# Patient Record
Sex: Female | Born: 1965 | Race: White | Hispanic: No | Smoking: Never smoker
Health system: Southern US, Community
[De-identification: ages and names within clinical notes are randomized; demographics above are authoritative.]

---

## 2018-02-21 NOTE — H&P (Signed)
Patient name Maria Beard. [facility] DICTATION# [id number] CSN# [id number]  Arvella Nigh, MD 02/21/2018 5:26 PM

## 2018-02-21 NOTE — H&P (Signed)
NAMEDWANA, GARIN [facility] MEDICAL RECORD [id number] ACCOUNT [id number] DATE OF BIRTH:04/07/1966 FACILITY: [facility] LOCATION: [facility] PHYSICIAN:Victorian Gunn Sherran Needs, MD  HISTORY AND PHYSICAL  DATE OF ADMISSION:  02/21/2018  In relation to the present admission, the patient was first seen in our practice on 09/11 of this year.  At that point in time, she had been followed for uterine fibroids and was on a progesterone-only pill in hopes of controlling heavy bleeding.  With  this, she had had several months without a period.  Then she would have 3 weeks of relatively heavy bleeding.  This last one lasted 2 weeks.  Her hemoglobin was 8.7.  We went ahead and stopped her progesterone-only pill and began on iron sulfate  supplementation.  We did a saline infusion ultrasound on 09/16 that showed either clots or endometrial polyps along with the fibroid.  The large fibroid was 4.5 cm.  In view of the polyps and/or clots, we are going to proceed with a hysteroscopy along  with D and C. She will continue on iron sulfate supplementation.  I did add birth control pills to her regimen.  ALLERGIES:  SHE HAS SEASONAL OUTDOOR ALLERGIES.  MEDICATIONS:  Oxybutynin 10 mg a day, Effexor 75 mg, Zyrtec and Prilosec.  PAST MEDICAL HISTORY:  She has a history of arthritis, migraine headaches, otherwise usual childhood diseases.  PAST SURGICAL HISTORY:  She has had 1 delivery in 1993 and delivery of twins in 1996.  SOCIAL HISTORY:  No tobacco or alcohol use.  FAMILY HISTORY:  Noncontributory.  REVIEW OF SYSTEMS:  Noncontributory.  PHYSICAL EXAMINATION: VITAL SIGNS:  The patient is afebrile with stable vital signs. HEENT:  The patient is normocephalic.  Pupils equal, round, reactive to light and accommodation.  Extraocular movements are intact.  Sclerae and conjunctivae are clear.  Oropharynx clear. LUNGS:  Clear. HEART:  Regular rhythm and rate without murmurs or gallops. ABDOMEN:  Benign.  No masses, megaly or  tenderness. PELVIC:  Normal external genitalia.  Vaginal mucosa is clear.  Cervix unremarkable.  Uterus upper limits normal size, irregular.  Adnexa unremarkable. EXTREMITIES:  Trace edema. NEUROLOGIC:  Grossly normal limits.  IMPRESSION: 1.  Perimenopausal abnormal uterine bleeding on progesterone-only pill. 2.  Uterine fibroids. 3.  Possible endometrial polyps versus clots.  PLAN:  The patient to undergo hysteroscopy with D and C. The risks of surgery have been discussed including the risk of infection.  The risk of hemorrhage that could require transfusion with the risk of AIDS or hepatitis.  Excessive bleeding could  require hysterectomy.  There is a risk of perforation with injury to adjacent organs.  This can require further exploratory surgery.  The risk of deep venous thrombosis and pulmonary embolus.  The patient expressed understanding of the indications and  risks.  LN/NUANCE  D:02/21/2018 T:02/21/2018 JOB:[id number]

## 2018-02-22 ENCOUNTER — Other Ambulatory Visit: Payer: Self-pay

## 2018-02-22 ENCOUNTER — Encounter (HOSPITAL_BASED_OUTPATIENT_CLINIC_OR_DEPARTMENT_OTHER): Payer: Self-pay | Admitting: *Deleted

## 2018-02-22 NOTE — Progress Notes (Signed)
Spoke with patient via telephone for pre op interview. Needs CBC and qualitative pregnancy test AM of surgery. NPO after MN. Will take prilosec and effexor AM of surgery with a sip of water. Patient to arrive at 0530.

## 2018-02-27 NOTE — Anesthesia Preprocedure Evaluation (Addendum)
Anesthesia Evaluation  Patient identified by MRN, date of birth, ID band Patient awake    Reviewed: Allergy & Precautions, NPO status , Patient's Chart, lab work & pertinent test results  Airway Mallampati: I  TM Distance: >3 FB Neck ROM: Full    Dental  (+) Teeth Intact, Missing,    Pulmonary    breath sounds clear to auscultation       Cardiovascular negative cardio ROS   Rhythm:Regular Rate:Normal     Neuro/Psych    GI/Hepatic negative GI ROS, Neg liver ROS,   Endo/Other  negative endocrine ROS  Renal/GU negative Renal ROS  negative genitourinary   Musculoskeletal negative musculoskeletal ROS (+)   Abdominal Normal abdominal exam  (+)   Peds  Hematology   Anesthesia Other Findings   Reproductive/Obstetrics                            Anesthesia Physical Anesthesia Plan  ASA: II  Anesthesia Plan: General   Post-op Pain Management:    Induction: Intravenous  PONV Risk Score and Plan: 4 or greater and Ondansetron, Dexamethasone, Midazolam and Scopolamine patch - Pre-op  Airway Management Planned: LMA  Additional Equipment:   Intra-op Plan:   Post-operative Plan: Extubation in OR  Informed Consent: I have reviewed the patients History and Physical, chart, labs and discussed the procedure including the risks, benefits and alternatives for the proposed anesthesia with the patient or authorized representative who has indicated his/her understanding and acceptance.   Dental advisory given  Plan Discussed with: CRNA  Anesthesia Plan Comments:        Anesthesia Quick Evaluation

## 2018-02-28 ENCOUNTER — Other Ambulatory Visit: Payer: Self-pay

## 2018-02-28 ENCOUNTER — Ambulatory Visit (HOSPITAL_BASED_OUTPATIENT_CLINIC_OR_DEPARTMENT_OTHER): Admitting: Anesthesiology

## 2018-02-28 ENCOUNTER — Encounter (HOSPITAL_BASED_OUTPATIENT_CLINIC_OR_DEPARTMENT_OTHER): Payer: Self-pay | Admitting: *Deleted

## 2018-02-28 ENCOUNTER — Encounter (HOSPITAL_BASED_OUTPATIENT_CLINIC_OR_DEPARTMENT_OTHER)
Admission: RE | Disposition: A | Discharge status: Home / Self Care | Payer: Self-pay | Source: Ambulatory Visit | Attending: Obstetrics and Gynecology

## 2018-02-28 ENCOUNTER — Ambulatory Visit (HOSPITAL_BASED_OUTPATIENT_CLINIC_OR_DEPARTMENT_OTHER)
Admission: RE | Admit: 2018-02-28 | Discharge: 2018-02-28 | Disposition: A | Discharge status: Home / Self Care | Source: Ambulatory Visit | Attending: Obstetrics and Gynecology | Admitting: Obstetrics and Gynecology

## 2018-02-28 DIAGNOSIS — N840 Polyp of corpus uteri: Secondary | ICD-10-CM | POA: Diagnosis not present

## 2018-02-28 DIAGNOSIS — N924 Excessive bleeding in the premenopausal period: Secondary | ICD-10-CM | POA: Insufficient documentation

## 2018-02-28 DIAGNOSIS — D259 Leiomyoma of uterus, unspecified: Secondary | ICD-10-CM | POA: Diagnosis not present

## 2018-02-28 HISTORY — PX: DILATATION & CURETTAGE/HYSTEROSCOPY WITH MYOSURE: SHX6511

## 2018-02-28 LAB — TYPE AND SCREEN
ABO/RH(D): A POS
Antibody Screen: NEGATIVE

## 2018-02-28 LAB — CBC
HCT: 31.9 % — ABNORMAL LOW (ref 36.0–46.0)
Hemoglobin: 9.3 g/dL — ABNORMAL LOW (ref 12.0–15.0)
MCH: 22.2 pg — AB (ref 26.0–34.0)
MCHC: 29.2 g/dL — ABNORMAL LOW (ref 30.0–36.0)
MCV: 76.3 fL — AB (ref 78.0–100.0)
PLATELETS: 260 10*3/uL (ref 150–400)
RBC: 4.18 MIL/uL (ref 3.87–5.11)
RDW: 20.7 % — AB (ref 11.5–15.5)
WBC: 8.2 10*3/uL (ref 4.0–10.5)

## 2018-02-28 LAB — ABO/RH: ABO/RH(D): A POS

## 2018-02-28 LAB — HCG, SERUM, QUALITATIVE: Preg, Serum: NEGATIVE

## 2018-02-28 SURGERY — DILATATION & CURETTAGE/HYSTEROSCOPY WITH MYOSURE
Anesthesia: General | Site: "Vagina "

## 2018-02-28 MED ORDER — FENTANYL CITRATE (PF) 100 MCG/2ML IJ SOLN
INTRAMUSCULAR | Status: DC | PRN
  Administered 2018-02-28: 50 ug via INTRAVENOUS
  Administered 2018-02-28 (×2): 25 ug via INTRAVENOUS

## 2018-02-28 MED ORDER — FENTANYL CITRATE (PF) 100 MCG/2ML IJ SOLN
INTRAMUSCULAR | Status: AC
  Filled 2018-02-28: qty 2

## 2018-02-28 MED ORDER — KETOROLAC TROMETHAMINE 30 MG/ML IJ SOLN
INTRAMUSCULAR | Status: DC | PRN
  Administered 2018-02-28: 30 mg via INTRAVENOUS

## 2018-02-28 MED ORDER — OXYCODONE HCL 5 MG PO TABS
5.0000 mg | ORAL_TABLET | Freq: Once | ORAL | Status: DC | PRN
  Filled 2018-02-28: qty 1

## 2018-02-28 MED ORDER — LACTATED RINGERS IV SOLN
INTRAVENOUS | Status: DC
  Administered 2018-02-28: 1000 mL via INTRAVENOUS
  Filled 2018-02-28: qty 1000

## 2018-02-28 MED ORDER — LACTATED RINGERS IV SOLN
INTRAVENOUS | Status: DC
  Administered 2018-02-28: 06:00:00 via INTRAVENOUS
  Filled 2018-02-28: qty 1000

## 2018-02-28 MED ORDER — PROPOFOL 10 MG/ML IV BOLUS
INTRAVENOUS | Status: AC
  Filled 2018-02-28: qty 40

## 2018-02-28 MED ORDER — OXYCODONE HCL 5 MG/5ML PO SOLN
5.0000 mg | Freq: Once | ORAL | Status: DC | PRN
  Filled 2018-02-28: qty 5

## 2018-02-28 MED ORDER — DEXAMETHASONE SODIUM PHOSPHATE 10 MG/ML IJ SOLN
INTRAMUSCULAR | Status: AC
  Filled 2018-02-28: qty 1

## 2018-02-28 MED ORDER — SODIUM CHLORIDE 0.9 % IR SOLN
Status: DC | PRN
  Administered 2018-02-28: 3000 mL

## 2018-02-28 MED ORDER — CEFAZOLIN SODIUM-DEXTROSE 2-4 GM/100ML-% IV SOLN
2.0000 g | INTRAVENOUS | Status: AC
  Administered 2018-02-28: 2 g via INTRAVENOUS
  Filled 2018-02-28: qty 100

## 2018-02-28 MED ORDER — ONDANSETRON HCL 4 MG/2ML IJ SOLN
INTRAMUSCULAR | Status: DC | PRN
  Administered 2018-02-28: 4 mg via INTRAVENOUS

## 2018-02-28 MED ORDER — MIDAZOLAM HCL 5 MG/5ML IJ SOLN
INTRAMUSCULAR | Status: DC | PRN
  Administered 2018-02-28: 2 mg via INTRAVENOUS

## 2018-02-28 MED ORDER — CEFAZOLIN SODIUM-DEXTROSE 2-4 GM/100ML-% IV SOLN
INTRAVENOUS | Status: AC
  Filled 2018-02-28: qty 100

## 2018-02-28 MED ORDER — PROPOFOL 10 MG/ML IV BOLUS
INTRAVENOUS | Status: DC | PRN
  Administered 2018-02-28: 50 mg via INTRAVENOUS
  Administered 2018-02-28: 150 mg via INTRAVENOUS

## 2018-02-28 MED ORDER — ONDANSETRON HCL 4 MG/2ML IJ SOLN
INTRAMUSCULAR | Status: AC
  Filled 2018-02-28: qty 2

## 2018-02-28 MED ORDER — LIDOCAINE 2% (20 MG/ML) 5 ML SYRINGE
INTRAMUSCULAR | Status: AC
  Filled 2018-02-28: qty 5

## 2018-02-28 MED ORDER — MIDAZOLAM HCL 2 MG/2ML IJ SOLN
INTRAMUSCULAR | Status: AC
  Filled 2018-02-28: qty 2

## 2018-02-28 MED ORDER — LIDOCAINE HCL (CARDIAC) PF 100 MG/5ML IV SOSY
PREFILLED_SYRINGE | INTRAVENOUS | Status: DC | PRN
  Administered 2018-02-28: 100 mg via INTRAVENOUS

## 2018-02-28 MED ORDER — MEPERIDINE HCL 25 MG/ML IJ SOLN
6.2500 mg | INTRAMUSCULAR | Status: DC | PRN
  Filled 2018-02-28: qty 1

## 2018-02-28 MED ORDER — PROMETHAZINE HCL 25 MG/ML IJ SOLN
6.2500 mg | INTRAMUSCULAR | Status: DC | PRN
  Filled 2018-02-28: qty 1

## 2018-02-28 MED ORDER — HYDROMORPHONE HCL 1 MG/ML IJ SOLN
0.2500 mg | INTRAMUSCULAR | Status: DC | PRN
  Filled 2018-02-28: qty 0.5

## 2018-02-28 MED ORDER — KETOROLAC TROMETHAMINE 30 MG/ML IJ SOLN
INTRAMUSCULAR | Status: AC
  Filled 2018-02-28: qty 1

## 2018-02-28 MED ORDER — DEXAMETHASONE SODIUM PHOSPHATE 4 MG/ML IJ SOLN
INTRAMUSCULAR | Status: DC | PRN
  Administered 2018-02-28: 10 mg via INTRAVENOUS

## 2018-02-28 SURGICAL SUPPLY — 26 items
CANISTER SUCT 3000ML PPV (MISCELLANEOUS) ×6 IMPLANT
CATH ROBINSON RED A/P 16FR (CATHETERS) ×3 IMPLANT
DEVICE MYOSURE LITE (MISCELLANEOUS) ×2 IMPLANT
DEVICE MYOSURE REACH (MISCELLANEOUS) IMPLANT
DILATOR CANAL MILEX (MISCELLANEOUS) IMPLANT
GAUZE SPONGE 4X4 16PLY XRAY LF (GAUZE/BANDAGES/DRESSINGS) ×3 IMPLANT
GLOVE BIO SURGEON STRL SZ 6.5 (GLOVE) ×2 IMPLANT
GLOVE BIO SURGEON STRL SZ7 (GLOVE) ×6 IMPLANT
GLOVE BIOGEL PI IND STRL 6.5 (GLOVE) ×1 IMPLANT
GLOVE BIOGEL PI IND STRL 7.5 (GLOVE) ×1 IMPLANT
GLOVE BIOGEL PI INDICATOR 6.5 (GLOVE) ×1
GLOVE BIOGEL PI INDICATOR 7.5 (GLOVE) ×1
GOWN STRL REUS W/TWL XL LVL3 (GOWN DISPOSABLE) ×3 IMPLANT
IV NS IRRIG 3000ML ARTHROMATIC (IV SOLUTION) ×3 IMPLANT
KIT PROCEDURE FLUENT (KITS) ×3 IMPLANT
KIT TURNOVER CYSTO (KITS) ×3 IMPLANT
MYOSURE XL FIBROID REM (MISCELLANEOUS)
NDL SPNL 18GX3.5 QUINCKE PK (NEEDLE) ×1 IMPLANT
NEEDLE SPNL 18GX3.5 QUINCKE PK (NEEDLE) ×2 IMPLANT
PACK VAGINAL MINOR WOMEN LF (CUSTOM PROCEDURE TRAY) ×3 IMPLANT
PAD OB MATERNITY 4.3X12.25 (PERSONAL CARE ITEMS) ×3 IMPLANT
PAD PREP 24X48 CUFFED NSTRL (MISCELLANEOUS) ×3 IMPLANT
SEAL ROD LENS SCOPE MYOSURE (ABLATOR) ×3 IMPLANT
SYSTEM TISS REMOVAL MYSR XL RM (MISCELLANEOUS) IMPLANT
TOWEL OR 17X24 6PK STRL BLUE (TOWEL DISPOSABLE) ×6 IMPLANT
WATER STERILE IRR 500ML POUR (IV SOLUTION) ×3 IMPLANT

## 2018-02-28 NOTE — Anesthesia Procedure Notes (Signed)
Procedure Name: LMA Insertion Date/Time: 02/28/2018 7:32 AM Performed by: Justice Rocher, CRNA Pre-anesthesia Checklist: Patient identified, Emergency Drugs available, Suction available and Patient being monitored Patient Re-evaluated:Patient Re-evaluated prior to induction Oxygen Delivery Method: Circle system utilized Preoxygenation: Pre-oxygenation with 100% oxygen Induction Type: IV induction Ventilation: Mask ventilation without difficulty LMA: LMA inserted LMA Size: 4.0 Number of attempts: 1 Airway Equipment and Method: Bite block Placement Confirmation: positive ETCO2 and breath sounds checked- equal and bilateral Tube secured with: Tape Dental Injury: Teeth and Oropharynx as per pre-operative assessment

## 2018-02-28 NOTE — Brief Op Note (Signed)
02/28/2018  7:48 AM  PATIENT:  Maria Beard  52 y.o. female  PRE-OPERATIVE DIAGNOSIS:  POLYPS  POST-OPERATIVE DIAGNOSIS:  POLYPS  PROCEDURE:  Procedure(s): DILATATION & CURETTAGE/HYSTEROSCOPY WITH MYOSURE (N/A)  SURGEON:  Surgeon(s) and Role:    * Soraida Vickers, MD - Primary  PHYSICIAN ASSISTANT:   ASSISTANTS: none   ANESTHESIA:   general  EBL:  5 mL   BLOOD ADMINISTERED:none  DRAINS: none and na   LOCAL MEDICATIONS USED:  NONE  SPECIMEN:  Source of Specimen:  endometrial polyp and curettings  DISPOSITION OF SPECIMEN:  PATHOLOGY  COUNTS:  YES  TOURNIQUET:  * No tourniquets in log *  DICTATION: .Other Dictation: Dictation Number [id number]  PLAN OF CARE: Discharge to home after PACU  PATIENT DISPOSITION:  PACU - hemodynamically stable.   Delay start of Pharmacological VTE agent (>24hrs) due to surgical blood loss or risk of bleeding: not applicable

## 2018-02-28 NOTE — Discharge Instructions (Signed)
° ° °  Do not take any nonsteroidal anti inflammatories (Ibuprofen, Advil, Aleve, Motrin) until after 1:45 pm today.   Post Anesthesia Home Care Instructions  Activity: Get plenty of rest for the remainder of the day. A responsible individual must stay with you for 24 hours following the procedure.  For the next 24 hours, DO NOT: -Drive a car -Paediatric nurse -Drink alcoholic beverages -Take any medication unless instructed by your physician -Make any legal decisions or sign important papers.  Meals: Start with liquid foods such as gelatin or soup. Progress to regular foods as tolerated. Avoid greasy, spicy, heavy foods. If nausea and/or vomiting occur, drink only clear liquids until the nausea and/or vomiting subsides. Call your physician if vomiting continues.  Special Instructions/Symptoms: Your throat may feel dry or sore from the anesthesia or the breathing tube placed in your throat during surgery. If this causes discomfort, gargle with warm salt water. The discomfort should disappear within 24 hours.

## 2018-02-28 NOTE — Transfer of Care (Signed)
Immediate Anesthesia Transfer of Care Note  Patient: Maria Beard  Procedure(s) Performed: Procedure(s) (LRB): DILATATION & CURETTAGE/HYSTEROSCOPY WITH MYOSURE (N/A)  Patient Location: PACU  Anesthesia Type: General  Level of Consciousness: awake, sedated, patient cooperative and responds to stimulation  Airway & Oxygen Therapy: Patient Spontanous Breathing and Patient connected to NCO2  Post-op Assessment: Report given to PACU RN, Post -op Vital signs reviewed and stable and Patient moving all extremities  Post vital signs: Reviewed and stable  Complications: No apparent anesthesia complications

## 2018-02-28 NOTE — H&P (Signed)
  History and physical exam unchanged 

## 2018-02-28 NOTE — Anesthesia Postprocedure Evaluation (Signed)
Anesthesia Post Note  Patient: Mizani Dilday  Procedure(s) Performed: DILATATION & CURETTAGE/HYSTEROSCOPY WITH MYOSURE (N/A Vagina )     Patient location during evaluation: PACU Anesthesia Type: General Level of consciousness: awake and alert Pain management: pain level controlled Vital Signs Assessment: post-procedure vital signs reviewed and stable Respiratory status: spontaneous breathing, nonlabored ventilation, respiratory function stable and patient connected to nasal cannula oxygen Cardiovascular status: blood pressure returned to baseline and stable Postop Assessment: no apparent nausea or vomiting Anesthetic complications: no    Last Vitals:  Vitals:   02/28/18 0854 02/28/18 0900  BP:  126/85  Pulse: 76 74  Resp: 18 17  Temp:    SpO2: 100% 97%    Last Pain:  Vitals:   02/28/18 0845  TempSrc:   PainSc: 0-No pain                 Effie Berkshire

## 2018-02-28 NOTE — Op Note (Signed)
NAMEAMARI, BURNSWORTH [facility] MEDICAL RECORD [id number] ACCOUNT [id number] DATE OF BIRTH:05-Jun-1966 FACILITY: [facility] LOCATION: [facility] PHYSICIAN:Kimiye Strathman Sherran Needs, MD  OPERATIVE REPORT  DATE OF PROCEDURE:  02/28/2018  PREOPERATIVE DIAGNOSIS:  Endometrial polyp.  POSTOPERATIVE DIAGNOSIS:  Endometrial polyp.  PROCEDURES:  Cervical dilation, hysteroscopy with resection of endometrial polyp.  BLOOD REPLACED:  None.  COMPLICATIONS:  None.  INDICATIONS:  See dictated history and physical.  DESCRIPTION OF PROCEDURE:  The patient was taken to the OR, placed in supine position.  After satisfactory level of general anesthesia was obtained, the patient was placed in the dorsal lithotomy position using the Allen stirrups.  Perineum prepped with  Betadine, draped in sterile field.  Speculum was placed in the vaginal vault.  Cervix grasped with Ardis Hughs tenaculum.  Uterus sounded to 9 cm.  Cervix was serially dilated.  Hysteroscope was introduced.  Intrauterine cavity was distended using saline.  On  visualization, she had 2 endometrial polyps on the posterior wall.  The rest of the endometrium looked smooth and atrophic.  We brought in the MyoSure and resected both polyps completely.  No other pathology was noted.  The hysteroscope was then  removed.  Deficit was minimal.  We then did endometrial curettings.  All tissue was sent to pathology.  At this point in time, the single tooth tenaculum removed.  The patient was taken out of dorsal lithotomy position, was alert and extubated and  transferred to recovery room in good condition.  Sponge, instrument and needle count were reported correct by the circulating nurse.  AN/NUANCE  D:02/28/2018 T:02/28/2018 JOB:[id number]

## 2018-03-01 ENCOUNTER — Encounter (HOSPITAL_BASED_OUTPATIENT_CLINIC_OR_DEPARTMENT_OTHER): Payer: Self-pay | Admitting: Obstetrics and Gynecology

## 2018-08-10 ENCOUNTER — Other Ambulatory Visit: Payer: Self-pay | Admitting: Obstetrics and Gynecology

## 2018-08-10 DIAGNOSIS — R928 Other abnormal and inconclusive findings on diagnostic imaging of breast: Secondary | ICD-10-CM

## 2018-08-16 ENCOUNTER — Ambulatory Visit: Admission: RE | Admit: 2018-08-16 | Source: Ambulatory Visit

## 2018-08-16 ENCOUNTER — Ambulatory Visit
Admission: RE | Admit: 2018-08-16 | Discharge: 2018-08-16 | Disposition: A | Discharge status: Home / Self Care | Payer: 59 | Source: Ambulatory Visit | Attending: Obstetrics and Gynecology | Admitting: Obstetrics and Gynecology

## 2018-08-16 ENCOUNTER — Other Ambulatory Visit: Payer: Self-pay | Admitting: Obstetrics and Gynecology

## 2018-08-16 DIAGNOSIS — R928 Other abnormal and inconclusive findings on diagnostic imaging of breast: Secondary | ICD-10-CM

## 2021-06-27 ENCOUNTER — Other Ambulatory Visit: Payer: Self-pay | Admitting: Obstetrics and Gynecology

## 2021-06-27 DIAGNOSIS — Z803 Family history of malignant neoplasm of breast: Secondary | ICD-10-CM

## 2021-07-07 ENCOUNTER — Encounter: Payer: Self-pay | Admitting: Registered"

## 2021-07-07 ENCOUNTER — Encounter: Payer: 59 | Attending: Obstetrics and Gynecology | Admitting: Registered"

## 2021-07-07 ENCOUNTER — Other Ambulatory Visit: Payer: Self-pay

## 2021-07-07 DIAGNOSIS — E785 Hyperlipidemia, unspecified: Secondary | ICD-10-CM | POA: Diagnosis present

## 2021-07-07 NOTE — Patient Instructions (Addendum)
Consider using you step tracker again. Look to see if your Fitbit has the cholesterol program to sign up.  Soluble fiber sources: Apples, beans, pistachios, oatmeal, flaxseed (ground) start with 1 tsp and work up to 2 tablespoons.  Reduce inflammation: Whole grains, fruits and vegetables (red grapes anti-inflammatory property of wine)  Continue reading labels for Sat fat and choosing lean meats, limiting cheese intake as you are not having every day and choose lower fat, continue having reduced fat dairy.  Continue using healthy fats such as olive oil, nuts such as almonds, walnuts, pecans, pistachios.  Keep a food diary for 3 days before your next appointment.

## 2021-07-07 NOTE — Progress Notes (Signed)
Medical Nutrition Therapy  Appointment Start time:  3818  Appointment End time:  1224  Primary concerns today: elevated cholesterol  Referral diagnosis: E78.5 hyperlipidemia Preferred learning style: no preference indicated Learning readiness: ready  NUTRITION ASSESSMENT   Anthropometrics  Not assessed this visit    Clinical Medical Hx: HTN controlled with medication, elevated cholesterol  Medications: reviewed, no cholesterol meds Labs: LDL 140 Notable Signs/Symptoms: weight gain  Lifestyle & Dietary Hx Pt states she noticed that when she lost weight with [employer] a few years ago her LDL cholesterol improved. Pt states she will take statin if needed, but would like to see what she can do with dietary changes.   Pt states rarely drinks sweet tea, no juice due to sugar content. Consumes skim or 1% dairy products. Pt reports she doesn't keep ice cream in the house because if there will eat every night before bed. Pt states she eats pretzels daily.   Pt reports she has fitbit, but not wearing lately, likes to walk with dogs, but hasn't lately due to weather. Pt states in the past the fitness tracker motivated her to walk more.  Estimated daily fluid intake: "not enough" doesn't think about it Supplements: none Sleep: 6 hr Stress / self-care: 4/10 Current average weekly physical activity: ADL  24-Hr Dietary Recall First Meal: eats ~2 hrs after waking Eggs & grits OR PB toast 100% whole wheat or pumpernickel OR yogurt [employer] [place] light & fit Snack: sometimes apple or cracker and cheese Second Meal: soup or [place] sandwich OR crackers, cheese, fruit Snack: same Third Meal: (mostly poultry for meat and mostly baked), at least 1 vegetable, not too many potatoes. Snack: same as above (frozen blueberries) Beverages: coffee, powdered coffeemate, almond creamer, 2-3 c in the morning, 1 can 12 oz diet caffeine free soda, coke or sprite tries to get clear.  NUTRITION DIAGNOSIS   NI-5.8.5 Inadeqate fiber intake As related to less than recommended intake of fruits and vegetables.  As evidenced by dietary recall.   NUTRITION INTERVENTION  Nutrition education (E-1) on the following topics:  Role of soluble fiber in lowering LDL cholesterol MyPlate Label reading  Handouts Provided Include  Using the Plate Method for a Balanced Meal Plan ([employer]) New Nutrition Facts Label  Learning Style & Readiness for Change Teaching method utilized: Visual & Auditory  Demonstrated degree of understanding via: Teach Back  Barriers to learning/adherence to lifestyle change: none  Goals Established by Pt Consider using you step tracker again. Look to see if your Fitbit has the cholesterol program to sign up.  Soluble fiber sources: Apples, beans, pistachios, oatmeal, flaxseed (ground) start with 1 tsp and work up to 2 tablespoons.  Reduce inflammation: Whole grains, fruits and vegetables (red grapes anti-inflammatory property of wine)  Continue reading labels for Sat fat and choosing lean meats, limiting cheese intake as you are not having every day and choose lower fat, continue having reduced fat dairy.  Continue using healthy fats such as olive oil, nuts such as almonds, walnuts, pecans, pistachios.  Keep a food diary for 3 days before your next appointment.  MONITORING & EVALUATION Dietary intake, weekly physical activity, in 4-6 weeks.  Next Steps  Patient is to keep a food diary prior to next appointment.

## 2021-07-15 ENCOUNTER — Ambulatory Visit
Admission: RE | Admit: 2021-07-15 | Discharge: 2021-07-15 | Disposition: A | Discharge status: Home / Self Care | Payer: 59 | Source: Ambulatory Visit | Attending: Obstetrics and Gynecology | Admitting: Obstetrics and Gynecology

## 2021-07-15 DIAGNOSIS — Z803 Family history of malignant neoplasm of breast: Secondary | ICD-10-CM

## 2021-07-15 MED ORDER — GADOBUTROL 1 MMOL/ML IV SOLN
7.0000 mL | Freq: Once | INTRAVENOUS | Status: AC | PRN
  Administered 2021-07-15: 7 mL via INTRAVENOUS

## 2021-07-18 ENCOUNTER — Other Ambulatory Visit: Payer: Self-pay | Admitting: Obstetrics and Gynecology

## 2021-07-18 DIAGNOSIS — Z09 Encounter for follow-up examination after completed treatment for conditions other than malignant neoplasm: Secondary | ICD-10-CM

## 2021-08-15 ENCOUNTER — Ambulatory Visit: Payer: 59 | Admitting: Registered"

## 2021-09-12 ENCOUNTER — Encounter: Payer: Self-pay | Admitting: Registered"

## 2021-09-12 ENCOUNTER — Encounter: Payer: 59 | Attending: Obstetrics and Gynecology | Admitting: Registered"

## 2021-09-12 DIAGNOSIS — E785 Hyperlipidemia, unspecified: Secondary | ICD-10-CM | POA: Insufficient documentation

## 2021-09-12 DIAGNOSIS — Z713 Dietary counseling and surveillance: Secondary | ICD-10-CM | POA: Insufficient documentation

## 2021-09-12 NOTE — Progress Notes (Signed)
Medical Nutrition Therapy  ?Appointment Start time:  0388  Appointment End time:  1137 ? ?Primary concerns today: elevated cholesterol  ?Referral diagnosis: E78.5 hyperlipidemia ?Preferred learning style: no preference indicated ?Learning readiness: ready ? ?NUTRITION ASSESSMENT  ? ?Anthropometrics  ?Not assessed this visit   ? ?Clinical ?Medical Hx: HTN controlled with medication, elevated cholesterol  ?Medications: reviewed, no cholesterol meds ?Labs: LDL 140 ?Notable Signs/Symptoms: weight gain ? ?Lifestyle & Dietary Hx ?Pt reports since last visit she has been diagnosed with osteopenia and has started taking calcium and increased her vitamin d supplement. Pt states she has also started a resistance exercise program by [employer], 60 day strong and fit challenge and is now in 3rd week. ? ?Pt states started putting flaxseed in her oatmeal and focusing on getting whole grains, and paying attention to how much fiber she is getting in diet.  ? ?Eats lunch at desk sometimes works through lunch. Pt states avoids eating too much bananas d/t a lot of potassium which irritates her bladder. ? ?Pt states she is interested in taking an Omega 3 supplement but will look into consumer reports to check on specific product claims. ? ?Estimated daily fluid intake: "has improved but needs to do better" ?Supplements: none ?Sleep: 6 hr ?Stress / self-care: 4/10 ?Current average weekly physical activity: ADL ? ?24-Hr Dietary Recall ?First Meal: eats ~2 hrs after waking ?Oatmeal OR [place] [place] light & fit OR fruit ?Snack: pretzels and mozzarella cheese ?Second Meal: [place] Boars head, 1 thin slice cheese sandwich whole grain bread  ?Snack: fruit ?Third Meal: chicken, rice, broccoli, cream of chicken soup reduced sodium, peas (typically [place] meatloaf, roasted carrots sometimes potatoes) ?Snack: same as above (frozen blueberries) continues ?Beverages: 3 c milk for the calcium, 2 c coffee with 2 tsp powdered coffeemate and 1-2 T almond  creamer, ~8 oz (less than before) diet caffeine free soda, coke or sprite tries to get clear. ? ?NUTRITION DIAGNOSIS  ?NI-5.8.5 Inadeqate fiber intake As related to less than recommended intake of fruits and vegetables.  As evidenced by dietary recall. ? ?NUTRITION INTERVENTION  ?Nutrition education (E-1) on the following topics:  ?Role of saturated fat in elevated blood LDL cholesterol ?Omega 3 ? ?Handouts Provided Include none ? ?Learning Style & Readiness for Change ?Teaching method utilized: Visual & Auditory  ?Demonstrated degree of understanding via: Teach Back  ?Barriers to learning/adherence to lifestyle change: none ? ?Goals Established by Pt ?Continue with changes to diet and exercise ? ?MONITORING & EVALUATION ?Dietary intake, weekly physical activity prn ? ?Next Steps  ?Keep appt with MD to get updated lipid lab work. Return for follow-up if desired. ?

## 2022-01-13 ENCOUNTER — Ambulatory Visit
Admission: RE | Admit: 2022-01-13 | Discharge: 2022-01-13 | Disposition: A | Discharge status: Home / Self Care | Payer: 59 | Source: Ambulatory Visit | Attending: Obstetrics and Gynecology | Admitting: Obstetrics and Gynecology

## 2022-01-13 DIAGNOSIS — Z09 Encounter for follow-up examination after completed treatment for conditions other than malignant neoplasm: Secondary | ICD-10-CM

## 2022-01-13 MED ORDER — GADOBUTROL 1 MMOL/ML IV SOLN
7.0000 mL | Freq: Once | INTRAVENOUS | Status: AC | PRN
  Administered 2022-01-13: 7 mL via INTRAVENOUS

## 2022-01-21 ENCOUNTER — Ambulatory Visit
Admission: RE | Admit: 2022-01-21 | Discharge: 2022-01-21 | Disposition: A | Discharge status: Home / Self Care | Payer: 59 | Source: Ambulatory Visit | Attending: Obstetrics and Gynecology | Admitting: Obstetrics and Gynecology

## 2022-01-21 ENCOUNTER — Other Ambulatory Visit: Payer: Self-pay | Admitting: Obstetrics and Gynecology

## 2022-01-21 DIAGNOSIS — R918 Other nonspecific abnormal finding of lung field: Secondary | ICD-10-CM

## 2022-12-03 IMAGING — MR MR BREAST BILAT WO/W CM
7 of 11 series · 33 of 48 positions shown · IV contrast (7ml gadavist)
Comparison: None.

CLINICAL DATA: High risk screening.  Elevated lifetime risk of 23%.

EXAM:
BILATERAL BREAST MRI WITH AND WITHOUT CONTRAST
TECHNIQUE: Multiplanar, multisequence MR images of both breasts were obtained
prior to and following the intravenous administration of 7 ml of
Gadavist

[Series 2: t2_tirm_tra ipat (a-p) · axial · 3.0mm · 0.78mm/px · 1 of 55 slices shown]
[im 1/55]
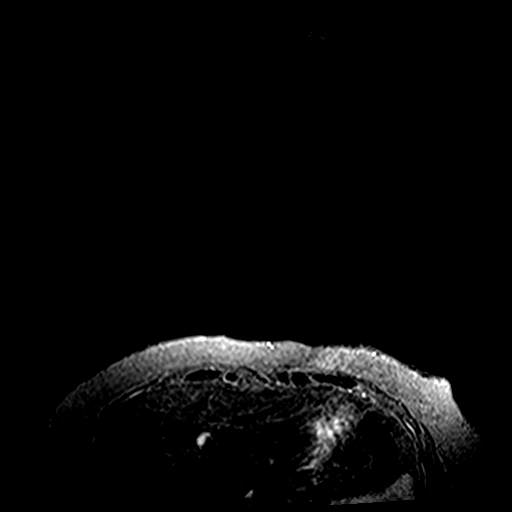

[Series 3: fl3d pre-cm no · axial · non-contrast · 1.2mm · 1.04mm/px · z∈[-75,+97]mm · 5 of 144 slices shown]
[im 1/144]
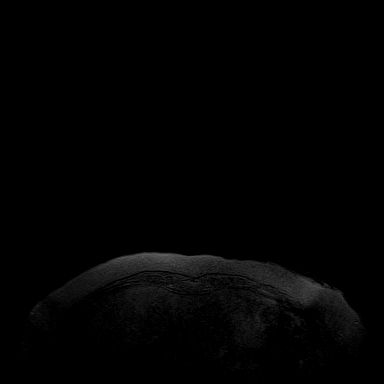
[im 36/144]
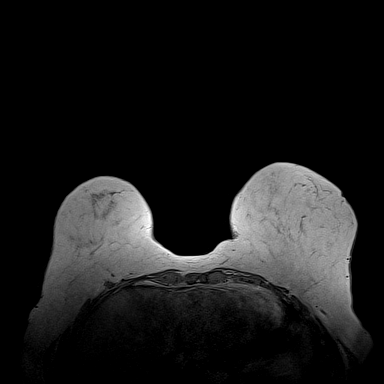
[im 72/144]
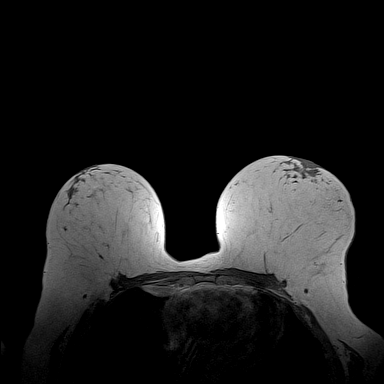
[im 108/144]
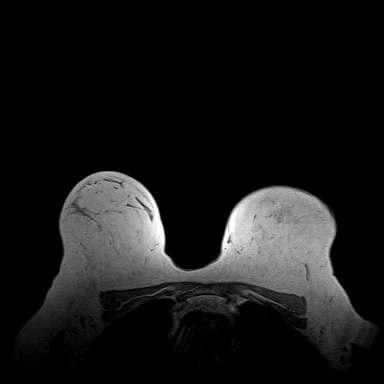
[im 144/144]
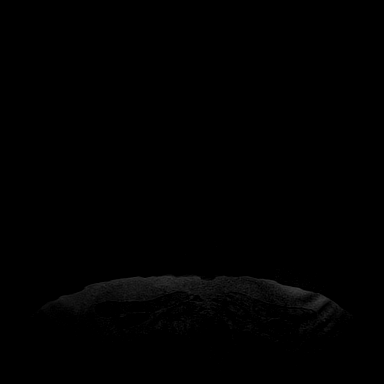

[Series 4: fl3d pre-cm · axial · non-contrast · 1.2mm · 1.04mm/px · z∈[-75,+97]mm · 5 of 144 slices shown]
[im 1/144]
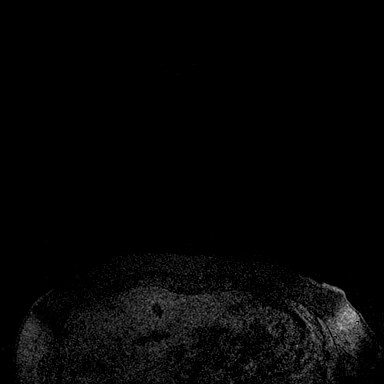
[im 36/144]
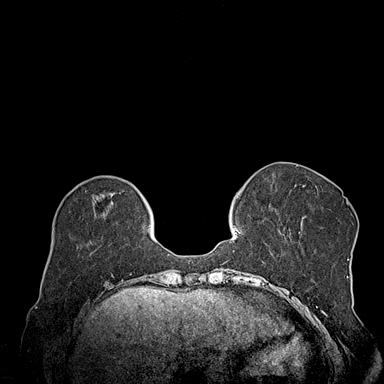
[im 72/144]
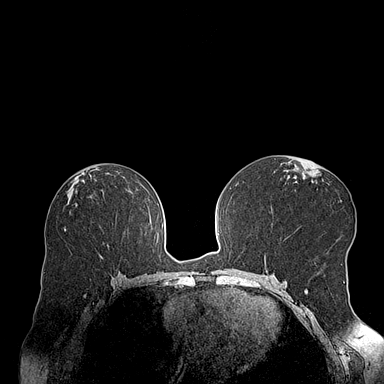
[im 108/144]
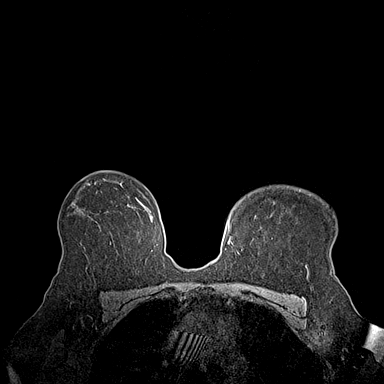
[im 144/144]
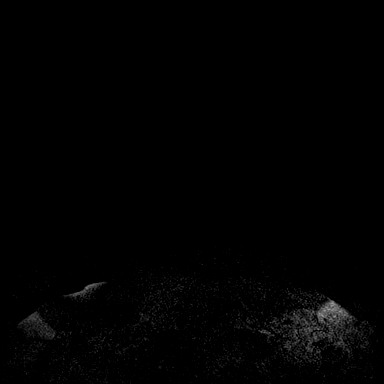

[Series 5: fl3d post-cm 20 · axial · 1.2mm · 1.04mm/px · z∈[-75,+97]mm · 5 of 144 slices shown (1 of 2)]
[im 1/144]
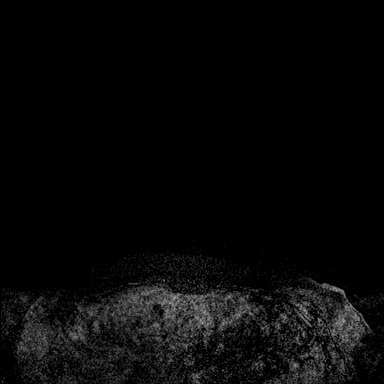
[im 36/144]
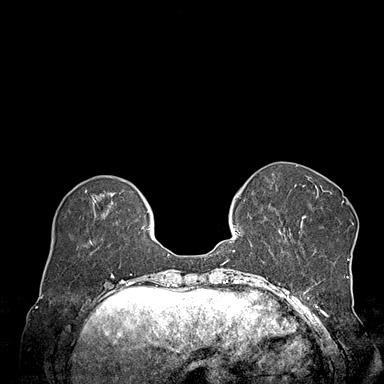
[im 72/144]
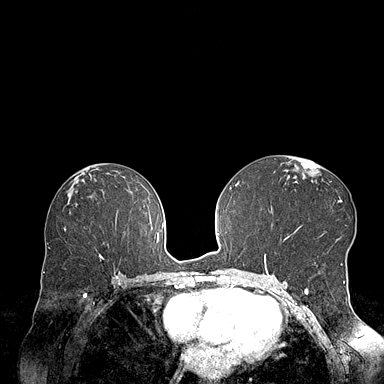
[im 108/144]
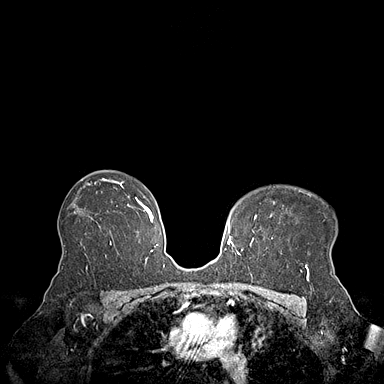
[im 144/144]
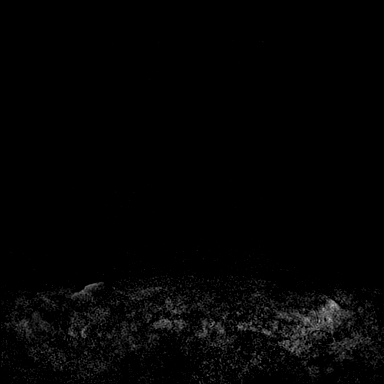

[Series 6: fl3d post-cm 20 · axial · 1.2mm · 1.04mm/px · z∈[-75,+97]mm · 6 of 144 slices shown (2 of 2)]
[im 1/144]
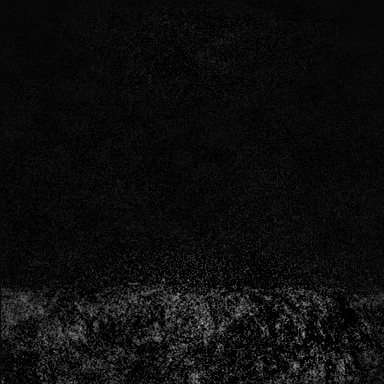
[im 29/144]
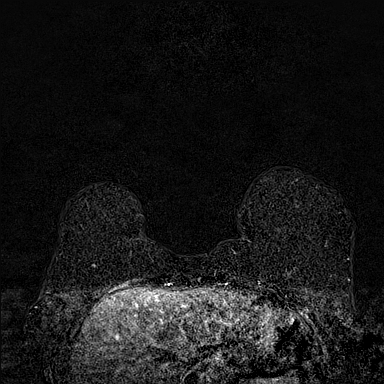
[im 58/144]
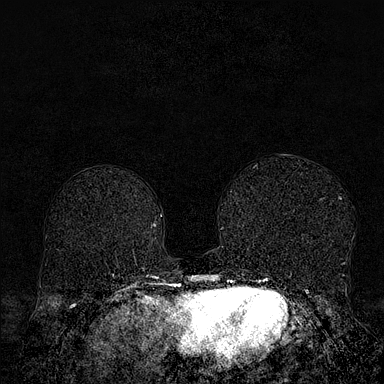
[im 86/144]
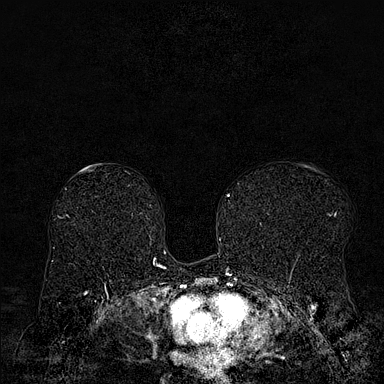
[im 115/144]
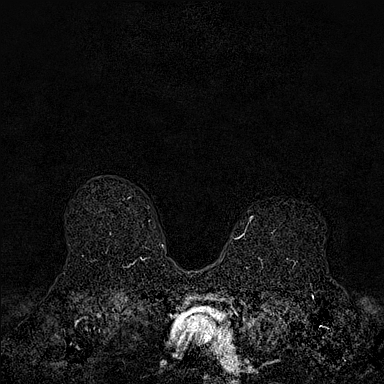
[im 144/144]
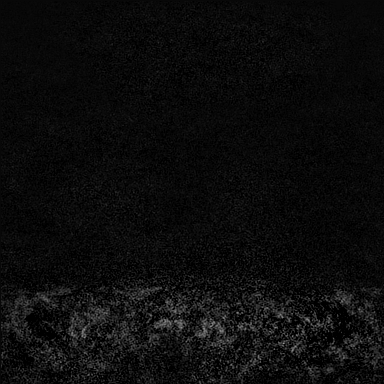

[Series 8: fl3d post-cm 3 · axial · 1.2mm · 1.04mm/px · z∈[-75,+97]mm · 6 of 144 slices shown (1 of 2)]
[im 1/144]
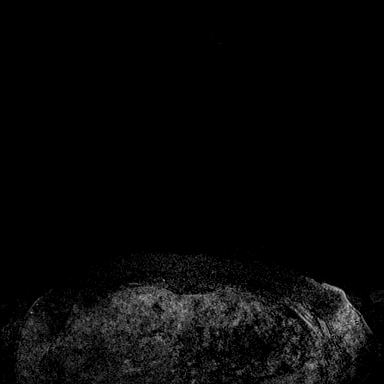
[im 29/144]
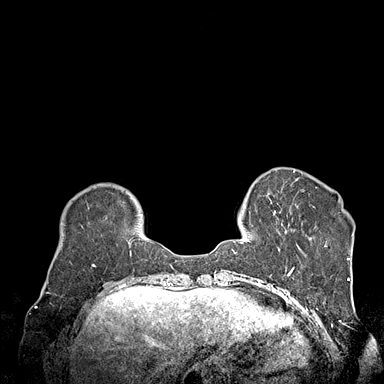
[im 58/144]
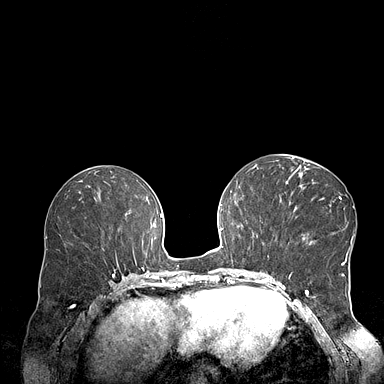
[im 86/144]
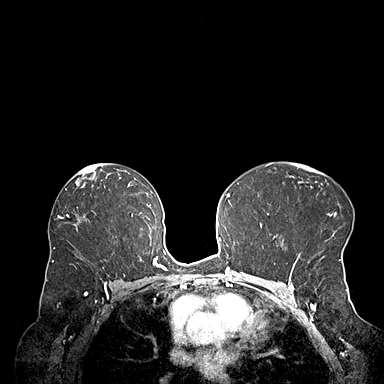
[im 115/144]
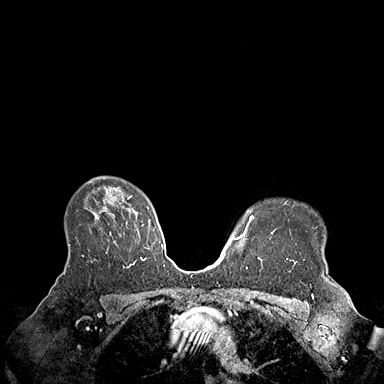
[im 144/144]
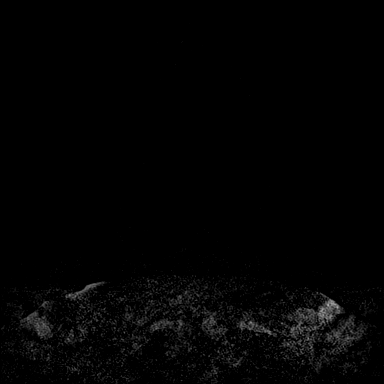

[Series 9: fl3d post-cm 3 · axial · 1.2mm · 1.04mm/px · z∈[-75,+62]mm · 5 of 144 slices shown (2 of 2)]
[im 1/144]
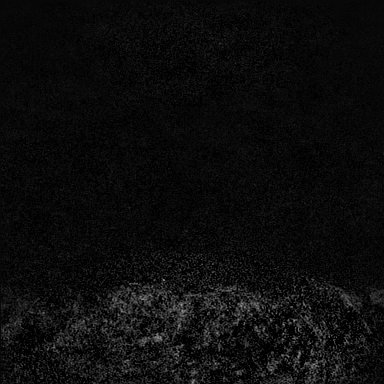
[im 29/144]
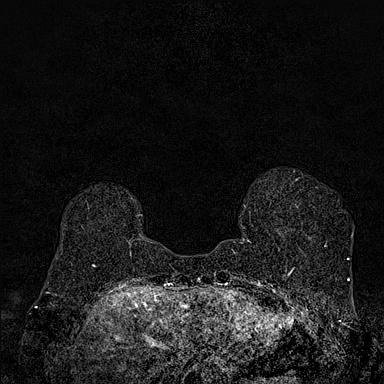
[im 58/144]
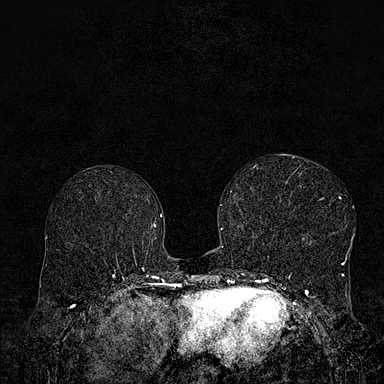
[im 86/144]
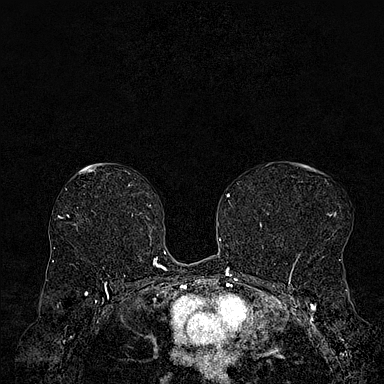
[im 115/144]
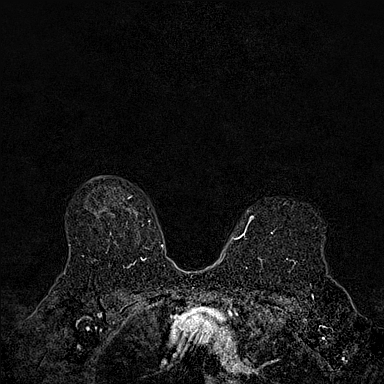

[33 of 48 positions shown; findings below may reference images not displayed]

Three-dimensional MR images were rendered by post-processing of the
original MR data on an independent workstation. The
three-dimensional MR images were interpreted, and findings are
reported in the following complete MRI report for this study. Three
dimensional images were evaluated at the independent interpreting
workstation using the DynaCAD thin client.
FINDINGS: Breast composition: b. Scattered fibroglandular tissue.

Background parenchymal enhancement: Moderate.

Right breast: No mass or abnormal enhancement.

Left breast: There is an oval circumscribed enhancing mass in the
lower inner left breast measuring approximately 0.6 cm (series 9,
image 113), with persistent kinetics. There are no additional areas
of enhancement in the left breast.

Lymph nodes: No abnormal appearing lymph nodes.

Ancillary findings:  None.
IMPRESSION: 1. Probably benign 0.6 cm enhancing mass in the lower inner left
breast.
2. No MRI evidence of malignancy in the right breast.

RECOMMENDATION:
Bilateral breast MRI in 6 months.

BI-RADS CATEGORY  3: Probably benign.

## 2022-12-17 ENCOUNTER — Other Ambulatory Visit: Payer: Self-pay | Admitting: Obstetrics and Gynecology

## 2022-12-17 DIAGNOSIS — Z803 Family history of malignant neoplasm of breast: Secondary | ICD-10-CM

## 2023-06-16 ENCOUNTER — Ambulatory Visit
Admission: RE | Admit: 2023-06-16 | Discharge: 2023-06-16 | Disposition: A | Discharge status: Home / Self Care | Payer: 59 | Source: Ambulatory Visit | Attending: Obstetrics and Gynecology | Admitting: Obstetrics and Gynecology

## 2023-06-16 DIAGNOSIS — Z803 Family history of malignant neoplasm of breast: Secondary | ICD-10-CM

## 2023-06-16 MED ORDER — GADOPICLENOL 0.5 MMOL/ML IV SOLN
7.5000 mL | Freq: Once | INTRAVENOUS | Status: AC | PRN
  Administered 2023-06-16: 7.5 mL via INTRAVENOUS

## 2024-02-18 ENCOUNTER — Other Ambulatory Visit: Payer: Self-pay | Admitting: Obstetrics and Gynecology

## 2024-02-18 DIAGNOSIS — Z8249 Family history of ischemic heart disease and other diseases of the circulatory system: Secondary | ICD-10-CM

## 2024-02-18 DIAGNOSIS — Z803 Family history of malignant neoplasm of breast: Secondary | ICD-10-CM

## 2024-03-16 ENCOUNTER — Other Ambulatory Visit

## 2024-03-23 ENCOUNTER — Ambulatory Visit
Admission: RE | Admit: 2024-03-23 | Discharge: 2024-03-23 | Disposition: A | Discharge status: Home / Self Care | Payer: Self-pay | Source: Ambulatory Visit | Attending: Obstetrics and Gynecology | Admitting: Obstetrics and Gynecology

## 2024-03-23 DIAGNOSIS — Z8249 Family history of ischemic heart disease and other diseases of the circulatory system: Secondary | ICD-10-CM

## 2024-05-17 NOTE — Progress Notes (Signed)
 Cardiology Office Note:   Date:  05/17/2024  ID:  Maria Beard, DOB January 30, 1966, MRN [id number] PCP: Patient, No Pcp Per  [facility] Providers Cardiologist:  Georganna Archer, MD { Chief Complaint:  Chief Complaint  Patient presents with   Establish Care      History of Present Illness:   Maria Beard is a 58 y.o. female with a PMH of HTN, HLD, GERD, chronic interstitial cystitis who presents as a new patient referral by Dr. John McComb for the evaluation of elevated calcium score.   The patient underwent CAC scoring on 03/27/2024 revealing a score of 10.2.  This was done simply for the patient to better understand her cardiovascular risk.  LDL on 02/18/2024 was 159 with triglycerides 115.  She presents today to better understand what this score means for her cardiovascular health.  She has no symptoms denying chest pain, SOB, PND, orthopnea, swelling, palpitations.  Her family history is notable for her dad who had HLD, but no other major cardiovascular family history.  She denies tobacco, alcohol, licit drug use.  She lives in [place] and works for [employer].  No other questions or concerns.   No past medical history on file.   Studies Reviewed:    EKG:  EKG Interpretation Date/Time:  Thursday May 18 2024 14:09:36 EST Ventricular Rate:  86 PR Interval:  170 QRS Duration:  74 QT Interval:  384 QTC Calculation: 459 R Axis:   32  Text Interpretation: Normal sinus rhythm Normal ECG No previous ECGs available Confirmed by Archer Georganna [phone]) on 05/18/2024 2:23:28 PM         Risk Assessment/Calculations:              Physical Exam:     VS:  BP 112/78   Pulse 86   Ht 5' 3 (1.6 m)   Wt 177 lb (80.3 kg)   SpO2 95%   BMI 31.35 kg/m      Wt Readings from Last 3 Encounters:  02/28/18 162 lb 6.4 oz (73.7 kg)     GEN: Well nourished, well developed, in no acute distress NECK: No JVD; No carotid bruits CARDIAC: RRR, no murmurs,  rubs, gallops RESPIRATORY:  Clear to auscultation without rales, wheezing or rhonchi  ABDOMEN: Soft, non-tender, non-distended, normal bowel sounds EXTREMITIES:  Warm and well perfused, no edema; No deformity, 2+ radial pulses PSYCH: Normal mood and affect   Assessment & Plan Coronary artery calcification - Patient presents for the evaluation of a calcium score of 10.2. -This calcium score represents mild coronary artery disease. -Her LDL is rather elevated however, and the patient is most interested in reducing her overall cardiovascular risk.  Even though by the book her LDL goal should be <100, based on the patient's individual goals of reducing risk, I think that is reasonable to pursue an LDL goal of <70.  - I recommend starting Crestor to help lower her LDL and I counseled the patient on healthy lifestyle modifications that can also improve cardiovascular risk. Start rosuvastatin 10 mg nightly Continue healthy lifestyle efforts Follow-up in 3 months Hyperlipidemia, unspecified hyperlipidemia type - LDL by OB/GYN in the 150s -Personalized patient goal of <70. - Will check lipid panel and lipoprotein a in 3 months Starting Crestor as above Lipid panel and LPA in 3 months Encounter for weight management - The patient would benefit greatly from a GLP-1 due to the presence of prediabetes, HTN, coronary calcifications, and a BMI greater than 27 per  the SELECT trial. - Will refer the patient to Pharm.D. for GLP-1 consideration Pharm.D. referral for GLP-1          This note was written with the assistance of a dictation microphone or AI dictation software. Please excuse any typos or grammatical errors.   Signed, Georganna Archer, MD 05/17/2024 7:43 PM    [facility]

## 2024-05-18 ENCOUNTER — Other Ambulatory Visit (HOSPITAL_COMMUNITY): Payer: Self-pay

## 2024-05-18 ENCOUNTER — Ambulatory Visit: Attending: Internal Medicine | Admitting: Student in an Organized Health Care Education/Training Program

## 2024-05-18 ENCOUNTER — Encounter: Payer: Self-pay | Admitting: Student in an Organized Health Care Education/Training Program

## 2024-05-18 VITALS — BP 112/78 | HR 86 | Ht 63.0 in | Wt 177.0 lb

## 2024-05-18 DIAGNOSIS — Z7689 Persons encountering health services in other specified circumstances: Secondary | ICD-10-CM | POA: Diagnosis not present

## 2024-05-18 DIAGNOSIS — E785 Hyperlipidemia, unspecified: Secondary | ICD-10-CM | POA: Diagnosis not present

## 2024-05-18 DIAGNOSIS — I2510 Atherosclerotic heart disease of native coronary artery without angina pectoris: Secondary | ICD-10-CM | POA: Diagnosis not present

## 2024-05-18 DIAGNOSIS — E6609 Other obesity due to excess calories: Secondary | ICD-10-CM | POA: Diagnosis not present

## 2024-05-18 DIAGNOSIS — Z6831 Body mass index (BMI) 31.0-31.9, adult: Secondary | ICD-10-CM

## 2024-05-18 DIAGNOSIS — E66811 Obesity, class 1: Secondary | ICD-10-CM

## 2024-05-18 DIAGNOSIS — E669 Obesity, unspecified: Secondary | ICD-10-CM | POA: Insufficient documentation

## 2024-05-18 MED ORDER — ROSUVASTATIN CALCIUM 10 MG PO TABS: 10.0000 mg | ORAL_TABLET | Freq: Every day | ORAL | 3 refills | Status: DC

## 2024-05-18 MED ORDER — ROSUVASTATIN CALCIUM 10 MG PO TABS
10.0000 mg | ORAL_TABLET | Freq: Every day | ORAL | 3 refills | Status: AC
  Filled 2024-05-18 (×2): qty 30, 30d supply, fill #0

## 2024-05-18 NOTE — Assessment & Plan Note (Signed)
-   Patient presents for the evaluation of a calcium score of 10.2. -This calcium score represents mild coronary artery disease. -Her LDL is rather elevated however, and the patient is most interested in reducing her overall cardiovascular risk.  Even though by the book her LDL goal should be <100, based on the patient's individual goals of reducing risk, I think that is reasonable to pursue an LDL goal of <70.  - I recommend starting Crestor to help lower her LDL and I counseled the patient on healthy lifestyle modifications that can also improve cardiovascular risk. Start rosuvastatin 10 mg nightly Continue healthy lifestyle efforts Follow-up in 3 months

## 2024-05-18 NOTE — Patient Instructions (Addendum)
 Medication Instructions:  START Rosuvastatin 10 mg daily   *If you need a refill on your cardiac medications before your next appointment, please call your pharmacy*  Lab Work in 3 months : Lipid panel LP(a)  If you have labs (blood work) drawn today and your tests are completely normal, you will receive your results only by: MyChart Message (if you have MyChart) OR A paper copy in the mail If you have any lab test that is abnormal or we need to change your treatment, we will call you to review the results.  PHARM-D REFERRAL   Follow-Up: At [facility], you and your health needs are our priority.  As part of our continuing mission to provide you with exceptional heart care, our providers are all part of one team.  This team includes your primary Cardiologist (physician) and Advanced Practice Providers or APPs (Physician Assistants and Nurse Practitioners) who all work together to provide you with the care you need, when you need it.  Your next appointment:   3 month(s)  Provider:   Georganna Archer, MD

## 2024-05-18 NOTE — Assessment & Plan Note (Signed)
-   LDL by OB/GYN in the 150s -Personalized patient goal of <70. - Will check lipid panel and lipoprotein a in 3 months Starting Crestor as above Lipid panel and LPA in 3 months

## 2024-06-28 ENCOUNTER — Other Ambulatory Visit (HOSPITAL_COMMUNITY): Payer: Self-pay

## 2024-06-29 NOTE — Progress Notes (Unsigned)
 Patient ID: Maria Beard                 DOB: 1965/07/26                    MRN: [id number]     HPI: Maria Beard is a 59 y.o. female patient referred to pharmacy clinic by Dr. Floretta to initiate GLP1-RA therapy. PMH is significant for HLD, prediabetes, HTN, mild coronary calcification (CAC score of 10.2), and obesity. Most recent BMI 31.4 kg/m.   According to [facility] note on 03/07/24, the patient's A1c was 6.1%. She was interested in weight-loss agents, and she was started on Wegovy; however, upon chart review, there is no fill history, likely due to insurance issues. ***  Baseline weight and BMI: 177lb, 31.4 kg/m  Current weight and BMI: *** kg/m  Current meds that affect weight: venlafaxine (weight gain or loss), metformin (neutral-to-mild weight loss)  At today's visit, *** Seems like she tried Wegovy in September? No fill history, insurance didn't cover?  Hx of pancreatitis or medullary thyroid cancer?  [facility] + [employer] insurance  Diet: ***  Exercise: ***  Family History:  No family history on file.  Social History:  Tobacco: never *** EtOH: ***  Labs: No results found for: HGBA1C  Wt Readings from Last 1 Encounters:  05/18/24 177 lb (80.3 kg)   BP Readings from Last 1 Encounters:  05/18/24 112/78   Pulse Readings from Last 1 Encounters:  05/18/24 86   No results found for: CHOL, TRIG, HDL, CHOLHDL, VLDL, LDLCALC, LDLDIRECT  No past medical history on file.  Medications Ordered Prior to Encounter[1]  Allergies[2]   Assessment/Plan:  1. Weight loss - Patient has not met goal of at least 5% of body weight loss with comprehensive lifestyle modifications alone in the past 3-6 months. Pharmacotherapy is appropriate to pursue as augmentation. Will start*** . Confirmed patient not ***pregnant and no personal or family history of medullary thyroid carcinoma (MTC) or Multiple Endocrine Neoplasia syndrome type 2 (MEN 2). Confirmed no history of  gallstones or pancreatitis. Injection technique reviewed at today's visit.  Advised patient on common side effects including nausea, diarrhea, dyspepsia, decreased appetite, and fatigue. Counseled patient on reducing meal size and how to titrate medication to minimize side effects. Counseled patient to call if intolerable side effects or if experiencing dehydration, abdominal pain, or dizziness. Along with pharmacotherapy, the patient will follow dietary modifications and aim for at least 150 minutes of moderate-intensity exercise per week, plus resistance training twice a week (as recommended by the American Heart Association). This resistance training--such as weightlifting, bodyweight exercises, or using resistance bands, adapted to the patients ability--will help prevent muscle loss.  Follow up in 1-2 days regarding coverage of *** . If therapy is initiated, phone follow-ups will be conducted every 4 weeks for dose titration until the patient reaches the effective therapeutic dose and target weight.  ***  Melissa D Maccia, Pharm.JONETTA SARAN, CPP [facility] A Division of  [facility] [address] [place], [place] [address]  Phone: [phone]; Fax: [phone]       [1]  Current Outpatient Medications on File Prior to Visit  Medication Sig Dispense Refill   Biotin 5000 MCG TABS      Blood Glucose Monitoring Suppl (ONETOUCH VERIO REFLECT) w/Device KIT USE TO CHECK BLOOD GLUCOSE TWICE A DAY     Calcium  Carbonate (CALCIUM  600 PO) Take by mouth.     cetirizine (  ZYRTEC) 10 MG tablet Take 10 mg by mouth daily.     cholecalciferol (VITAMIN D3) 25 MCG (1000 UNIT) tablet Take 2,000 Units by mouth daily.     clobetasol cream (TEMOVATE) 0.05 % APPLY A THIN LAYER TO THE AFFECTED AREA(S) BY TOPICAL ROUTE 1 TIMES PER DAY AS NEEDED     famotidine (PEPCID) 10 MG tablet Take 10 mg by mouth 2 (two) times daily.     fexofenadine (ALLEGRA) 180 MG tablet 1 tablet Swallow whole  with water; do not take with fruit juices. Orally Once a day as needed     hydrochlorothiazide (HYDRODIURIL) 25 MG tablet Take 25 mg by mouth daily.     Iron-FA-B Cmp-C-Biot-Probiotic (FUSION PLUS PO) Take 1 tablet by mouth daily.     metFORMIN (GLUCOPHAGE-XR) 500 MG 24 hr tablet      minoxidil (LONITEN) 2.5 MG tablet      Norethin Ace-Eth Estrad-FE (TAYTULLA) 1-20 MG-MCG(24) CAPS Take 2 tablets by mouth daily. Will take one tablet daily starting 02/24/2018     omeprazole (PRILOSEC) 20 MG capsule Take 20 mg by mouth daily.     Omeprazole Magnesium (PRILOSEC PO)      ONETOUCH VERIO test strip 2 (two) times daily.     oxybutynin (DITROPAN-XL) 10 MG 24 hr tablet Take 10 mg by mouth at bedtime.     rosuvastatin  (CRESTOR ) 10 MG tablet Take 1 tablet (10 mg total) by mouth daily. 90 tablet 3   venlafaxine (EFFEXOR) 75 MG tablet Take 75 mg by mouth daily after breakfast.     No current facility-administered medications on file prior to visit.  [2]  Allergies Allergen Reactions   Pyridium [Phenazopyridine Hcl] Other (See Comments)    Patient stated that pyridium made her lips numb.   Sulfa Antibiotics Rash

## 2024-07-04 ENCOUNTER — Ambulatory Visit: Attending: Pharmacist | Admitting: Pharmacist

## 2024-08-15 ENCOUNTER — Ambulatory Visit: Admitting: Student in an Organized Health Care Education/Training Program
